# Patient Record
Sex: Male | Born: 2000 | Race: White | Hispanic: No | Marital: Single | Smoking: Never smoker
Health system: Southern US, Community
[De-identification: ages and names within clinical notes are randomized; demographics above are authoritative.]

## PROBLEM LIST (undated history)

## (undated) DIAGNOSIS — T781XXA Other adverse food reactions, not elsewhere classified, initial encounter: Secondary | ICD-10-CM

## (undated) DIAGNOSIS — M216X9 Other acquired deformities of unspecified foot: Secondary | ICD-10-CM

## (undated) HISTORY — DX: Other acquired deformities of unspecified foot: M21.6X9

## (undated) HISTORY — PX: OTHER SURGICAL HISTORY: SHX169

## (undated) HISTORY — DX: Other adverse food reactions, not elsewhere classified, initial encounter: T78.1XXA

---
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
## 2021-01-12 ENCOUNTER — Ambulatory Visit: Payer: BC Managed Care – PPO | Admitting: Cardiology

## 2021-01-12 ENCOUNTER — Encounter: Payer: Self-pay | Admitting: Cardiology

## 2021-01-12 ENCOUNTER — Other Ambulatory Visit: Payer: Self-pay

## 2021-01-12 VITALS — BP 128/78 | HR 78 | Ht 70.0 in | Wt 164.1 lb

## 2021-01-12 DIAGNOSIS — R002 Palpitations: Secondary | ICD-10-CM

## 2021-01-12 DIAGNOSIS — R0789 Other chest pain: Secondary | ICD-10-CM | POA: Diagnosis not present

## 2021-01-12 NOTE — Patient Instructions (Signed)
Medication Instructions:  Your Physician recommend you continue on your current medication as directed.    *If you need a refill on your cardiac medications before your next appointment, please call your pharmacy*   Lab Work: None ordered today   Testing/Procedures: None ordered today   Follow-Up: At [facility] you and your health needs are our priority.  As part of our continuing mission to provide you with exceptional heart care, we have created designated Provider Care Teams.  These Care Teams include your primary Cardiologist (physician) and Advanced Practice Providers (APPs -  Physician Assistants and Nurse Practitioners) who all work together to provide you with the care you need, when you need it.  We recommend signing up for the patient portal called "MyChart".  Sign up information is provided on this After Visit Summary.  MyChart is used to connect with patients for Virtual Visits (Telemedicine).  Patients are able to view lab/test results, encounter notes, upcoming appointments, etc.  Non-urgent messages can be sent to your provider as well.   To learn more about what you can do with MyChart, go to [email or web address].    Your next appointment:   As needed  The format for your next appointment:   In Person  Provider:   Bryan Lemma, MD

## 2021-01-12 NOTE — Assessment & Plan Note (Signed)
Chest pain most consistent with musculoskeletal discomfort.  Highly negative GXT reaching 94% MPHR -> 190 bpm with no symptoms.  [facility] treadmill score 15.5.  No further testing required.

## 2021-01-12 NOTE — Assessment & Plan Note (Signed)
Most likely still having some symptomatic PACs and PVCs.  No signs of SVT or arrhythmia.  Clearly in this situation they are driven by anxiety/stress as well as probably not enough sleep.  We discussed stress relief and avoidance techniques.  In the absence of any active arrhythmias, would hold off on further testing.

## 2021-01-12 NOTE — Progress Notes (Signed)
Primary Care Provider: Tresea Mall, MD [facility] Cardiologist: None Electrophysiologist: None  Clinic Note: Chief Complaint  Patient presents with   Other    1 month f/u zio and ETT. Meds reviewed verbally with pt.   ===================================  ASSESSMENT/PLAN   Problem List Items Addressed This Visit     Atypical chest pain    Chest pain most consistent with musculoskeletal discomfort.  Highly negative GXT reaching 94% MPHR -> 190 bpm with no symptoms.  [facility] treadmill score 15.5.  No further testing required.      Palpitations - Primary    Most likely still having some symptomatic PACs and PVCs.  No signs of SVT or arrhythmia.  Clearly in this situation they are driven by anxiety/stress as well as probably not enough sleep.  We discussed stress relief and avoidance techniques.  In the absence of any active arrhythmias, would hold off on further testing.      Relevant Orders   EKG 12-Lead   Okay for PRN follow-up  ===================================  HPI:    [name] is a 20 y.o. 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 and Army Fossa, MD  He was referred by Dr. Army Fossa based on clinic visit on December 04, 2020 for about a 2-3-week history of symptoms of skipping heartbeats/forceful beats (lasting a couple seconds) along with left-sided chest discomfort (lasting 15 to 20 seconds).   ->  Referred to cardiology.  Given Pepcid Also of note, in September (11/08/2020) he was seen in the same clinic by Dr. Jackson Latino for physical examination precollege.  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  By the time of this visit, he actually was starting to feel tolerated better.  Waymond Stoltenberg  Morel was seen for initial consultation on December 08, 2020 for atypical chest pain and palpitations.  Left-sided chest pain actually midclavicular line just above the nipple is a very 0.2.  He has off-and-on sharp pain that comes and goes several times a day.  His episodes last about 20-30 seconds, and go away least quickly as they come.  He is not able to tell if there is any particular exacerbating features.  No real association with activity or certain movements.  Not made worse with exertion or deep breath.  Actually better with lying down. Palpitations-skipped beats.  He describes forceful pounding heartbeats that also come and go but multiple times a day.  These last maybe 5 to 10 seconds-at the most 30 seconds.  Not associated with lightheadedness or dizziness or chest pain.  They just make him somewhat worried.  Recent Hospitalizations: None  Reviewed  CV studies:    The following studies were reviewed today: (if available, images/films reviewed: From Epic Chart or Care Everywhere) GXT 12/24/2020: Exercised for 13:10 min.  15.5 METS.  Resting heart rate 88 bpm.  Maximum 190 bpm which is 94% of MPHR of 03/07/1999.  Normal BP response.  No ST changes.  Excellent rate recovery.  Stopped because of inability to continue walking fast.  Very low risk Monitor 12/2020: Predominant sinus rhythm.  Minimum HR 44 bpm, maximum 138 bpm.  Average 71 bpm.  Rare PACs and PVCs.  No evidence of arrhythmias.   Interval History:  Francisco Kaufman presents here today along with his mother.  He states that he is actually been feeling a whole lot better of late.  He has had some intermittent palpitation symptoms and he tends to think that these are probably triggered by anxiety or stress.  He describes the the discomfort in his chest as the palpitations.  No prolonged rapid heart rate spells.  Just skipping beats.  The sharp chest pain is no longer present.  Overall doing much better.  CV Review of  Symptoms (Summary) Cardiovascular ROS: no chest pain or dyspnea on exertion positive for - irregular heartbeat, palpitations, and the symptoms are mostly driven by stress/anxiety negative for - chest pain, dyspnea on exertion, edema, orthopnea, paroxysmal nocturnal dyspnea, rapid heart rate, shortness of breath, or lightheadedness, dizziness or wooziness, syncope/near syncope or TIA/amaurosis fugax, claudication  REVIEWED OF SYSTEMS   Review of Systems  Constitutional:  Negative for malaise/fatigue and weight loss.  HENT:  Negative for congestion and nosebleeds.   Respiratory:  Negative for cough and wheezing.   Cardiovascular:        Negative per HPI  Gastrointestinal:  Negative for constipation and diarrhea.       No longer having meat allergy  Genitourinary:  Negative for hematuria.  Musculoskeletal:  Negative for joint pain.  Neurological:  Negative for dizziness, focal weakness and weakness.  Psychiatric/Behavioral:  Negative for depression and memory loss. The patient is nervous/anxious.        Seems to be stressed out with school.  Mother thinks that he needs to take a break.   I have reviewed and (if needed) personally updated the patient's problem list, medications, allergies, past medical and surgical history, social and family history.   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  Now tolerating meats.    PAST SURGICAL HISTORY   Past Surgical History:  Procedure Laterality Date   ETT  12/24/2020   Exercised for 13:10 min.  15.5 METS.  Resting heart rate 88 bpm.  Maximum 190 bpm which is 94% of MPHR of 03/07/1999.  Normal BP response.  No ST changes.  Excellent rate recovery.  Stopped because of inability to continue walking fast.  Very low risk   Event Monitor  12/2020   Predominant sinus rhythm.  Minimum HR 44 bpm, maximum  138 bpm.  Average 71 bpm.  Rare PACs and PVCs.  No evidence of arrhythmias.   none      There is no immunization history on file for this patient.  MEDICATIONS/ALLERGIES   Current Meds  Medication Sig   famotidine (PEPCID) 20 MG tablet Take 20 mg by mouth 2 (two) times daily.    No Known Allergies  SOCIAL HISTORY/FAMILY HISTORY   Reviewed in Epic:   Social History   Tobacco Use   Smoking status: Never   Smokeless tobacco: Never  Vaping Use   Vaping Use: Former   Devices: Vaping x 1 year  Substance Use Topics   Alcohol use: Yes    Comment: Occasional-social   Drug use: Not Currently    Types: Marijuana   Social History   Social History Narrative   Freshman at [employer].   Acknowledges eating a healthy diet, limiting junk food.   Rarely drinks coffee and does not drink sodas.   Is relatively  active, enjoys playing sports   History reviewed. No pertinent family history.  OBJCTIVE -PE, EKG, labs   Wt Readings from Last 3 Encounters:  01/12/21 164 lb 2 oz (74.4 kg) (64 %, Z= 0.37)*  12/08/20 155 lb 6 oz (70.5 kg) (52 %, Z= 0.05)*  10/13/14 102 lb (46.3 kg) (45 %, Z= -0.12)*   * Growth percentiles are based on CDC (Boys, 2-20 Years) data.    Physical Exam: BP 128/78 (BP Location: Left Arm, Patient Position: Sitting, Cuff Size: Normal)   Pulse 78   Ht 5\' 10"  (1.778 m)   Wt 164 lb 2 oz (74.4 kg)   SpO2 99%   BMI 23.55 kg/m  Physical Exam Vitals reviewed.  Constitutional:      Appearance: Normal appearance. He is normal weight.  HENT:     Head: Normocephalic and atraumatic.  Cardiovascular:     Rate and Rhythm: Normal rate and regular rhythm.     Pulses: Normal pulses.     Heart sounds: Normal heart sounds. No murmur heard.   No friction rub. No gallop.  Pulmonary:     Effort: No respiratory distress.  Musculoskeletal:        General: No swelling.  Skin:    General: Skin is warm and dry.  Neurological:     General: No focal deficit present.      Mental Status: He is alert and oriented to person, place, and time.  Psychiatric:        Mood and Affect: Mood normal.        Behavior: Behavior normal.        Thought Content: Thought content normal.        Judgment: Judgment normal.     Adult ECG Report none  Recent Labs: N/A No results found for: CHOL, HDL, LDLCALC, LDLDIRECT, TRIG, CHOLHDL No results found for: CREATININE, BUN, NA, K, CL, CO2 No flowsheet data found.  No results found for: HGBA1C No results found for: TSH  ==================================================  COVID-19 Education: The signs and symptoms of COVID-19 were discussed with the patient and how to seek care for testing (follow up with PCP or arrange E-visit).    I spent a total of 25 minutes with the patient spent in direct patient consultation.  Additional time spent with chart review  / charting (studies, outside notes, etc): 37min Total Time: 31  min  Current medicines are reviewed at length with the patient today.  (+/- concerns) N/A  This visit occurred during the SARS-CoV-2 public health emergency.  Safety protocols were in place, including screening questions prior to the visit, additional usage of staff PPE, and extensive cleaning of exam room while observing appropriate contact time as indicated for disinfecting solutions.  Notice: This dictation was prepared with Dragon dictation along with smart phrase technology. Any transcriptional errors that result from this process are unintentional and may not be corrected upon review.   Studies Ordered:  Orders Placed This Encounter  Procedures   EKG 12-Lead    Patient Instructions / Medication Changes & Studies & Tests Ordered   Patient Instructions  Medication Instructions:  Your Physician recommend you continue on your current medication as directed.    *If you need a refill on your cardiac medications before your next appointment, please call your pharmacy*   Lab Work: None ordered  today   Testing/Procedures: None ordered today   Follow-Up: At [facility], you and your health needs are our priority.  As part of our continuing  mission to provide you with exceptional heart care, we have created designated Provider Care Teams.  These Care Teams include your primary Cardiologist (physician) and Advanced Practice Providers (APPs -  Physician Assistants and Nurse Practitioners) who all work together to provide you with the care you need, when you need it.  We recommend signing up for the patient portal called "MyChart".  Sign up information is provided on this After Visit Summary.  MyChart is used to connect with patients for Virtual Visits (Telemedicine).  Patients are able to view lab/test results, encounter notes, upcoming appointments, etc.  Non-urgent messages can be sent to your provider as well.   To learn more about what you can do with MyChart, go to [email or web address].    Your next appointment:   As needed  The format for your next appointment:   In Person  Provider:   Glenetta Hew, MD     Glenetta Hew, M.D., M.S. Interventional Cardiologist   Pager # [phone]  [place], [place] [address]   Thank you for choosing Heartcare at [facility]!!
